# Patient Record
Sex: Female | Born: 1955 | Race: White | Hispanic: No | Marital: Married | Smoking: Never smoker
Health system: Southern US, Community
[De-identification: ages and names within clinical notes are randomized; demographics above are authoritative.]

## PROBLEM LIST (undated history)

## (undated) DIAGNOSIS — E079 Disorder of thyroid, unspecified: Secondary | ICD-10-CM

## (undated) HISTORY — PX: THYROIDECTOMY: SHX17

## (undated) HISTORY — PX: CHOLECYSTECTOMY: SHX55

## (undated) HISTORY — PX: TONSILLECTOMY: SUR1361

---

## 1999-10-13 ENCOUNTER — Encounter: Payer: Self-pay | Admitting: Family Medicine

## 1999-10-13 ENCOUNTER — Encounter: Admission: RE | Admit: 1999-10-13 | Discharge: 1999-10-13 | Payer: Self-pay | Admitting: Family Medicine

## 2000-04-10 ENCOUNTER — Encounter: Admission: RE | Admit: 2000-04-10 | Discharge: 2000-04-10 | Payer: Self-pay | Admitting: Family Medicine

## 2000-04-10 ENCOUNTER — Encounter: Payer: Self-pay | Admitting: Family Medicine

## 2001-04-11 ENCOUNTER — Encounter: Admission: RE | Admit: 2001-04-11 | Discharge: 2001-04-11 | Payer: Self-pay | Admitting: Family Medicine

## 2001-04-11 ENCOUNTER — Encounter: Payer: Self-pay | Admitting: Family Medicine

## 2004-01-21 ENCOUNTER — Encounter: Admission: RE | Admit: 2004-01-21 | Discharge: 2004-01-21 | Payer: Self-pay | Admitting: Family Medicine

## 2004-02-02 ENCOUNTER — Other Ambulatory Visit: Admission: RE | Admit: 2004-02-02 | Discharge: 2004-02-02 | Payer: Self-pay | Admitting: Family Medicine

## 2005-02-17 ENCOUNTER — Encounter: Admission: RE | Admit: 2005-02-17 | Discharge: 2005-02-17 | Payer: Self-pay | Admitting: Family Medicine

## 2005-02-21 ENCOUNTER — Other Ambulatory Visit: Admission: RE | Admit: 2005-02-21 | Discharge: 2005-02-21 | Payer: Self-pay | Admitting: Family Medicine

## 2006-02-24 ENCOUNTER — Encounter: Admission: RE | Admit: 2006-02-24 | Discharge: 2006-02-24 | Payer: Self-pay | Admitting: Family Medicine

## 2006-03-06 ENCOUNTER — Encounter: Admission: RE | Admit: 2006-03-06 | Discharge: 2006-03-06 | Payer: Self-pay | Admitting: Family Medicine

## 2006-03-07 ENCOUNTER — Other Ambulatory Visit: Admission: RE | Admit: 2006-03-07 | Discharge: 2006-03-07 | Payer: Self-pay | Admitting: Family Medicine

## 2007-03-19 ENCOUNTER — Encounter: Admission: RE | Admit: 2007-03-19 | Discharge: 2007-03-19 | Payer: Self-pay | Admitting: Family Medicine

## 2007-05-29 ENCOUNTER — Other Ambulatory Visit: Admission: RE | Admit: 2007-05-29 | Discharge: 2007-05-29 | Payer: Self-pay | Admitting: Family Medicine

## 2008-03-21 ENCOUNTER — Encounter: Admission: RE | Admit: 2008-03-21 | Discharge: 2008-03-21 | Payer: Self-pay | Admitting: Family Medicine

## 2009-03-26 ENCOUNTER — Encounter: Admission: RE | Admit: 2009-03-26 | Discharge: 2009-03-26 | Payer: Self-pay | Admitting: Family Medicine

## 2009-06-11 ENCOUNTER — Other Ambulatory Visit: Admission: RE | Admit: 2009-06-11 | Discharge: 2009-06-11 | Payer: Self-pay | Admitting: Family Medicine

## 2010-04-13 ENCOUNTER — Encounter: Admission: RE | Admit: 2010-04-13 | Discharge: 2010-04-13 | Payer: Self-pay | Admitting: Family Medicine

## 2011-06-08 ENCOUNTER — Other Ambulatory Visit: Payer: Self-pay | Admitting: Family Medicine

## 2011-06-08 DIAGNOSIS — Z1231 Encounter for screening mammogram for malignant neoplasm of breast: Secondary | ICD-10-CM

## 2011-06-20 ENCOUNTER — Ambulatory Visit
Admission: RE | Admit: 2011-06-20 | Discharge: 2011-06-20 | Disposition: A | Discharge status: Home / Self Care | Payer: BC Managed Care – PPO | Source: Ambulatory Visit | Attending: Family Medicine | Admitting: Family Medicine

## 2011-06-20 DIAGNOSIS — Z1231 Encounter for screening mammogram for malignant neoplasm of breast: Secondary | ICD-10-CM

## 2012-06-25 ENCOUNTER — Other Ambulatory Visit: Payer: Self-pay | Admitting: Family Medicine

## 2012-06-25 DIAGNOSIS — Z1231 Encounter for screening mammogram for malignant neoplasm of breast: Secondary | ICD-10-CM

## 2012-07-18 ENCOUNTER — Ambulatory Visit
Admission: RE | Admit: 2012-07-18 | Discharge: 2012-07-18 | Disposition: A | Discharge status: Home / Self Care | Payer: BC Managed Care – PPO | Source: Ambulatory Visit | Attending: Family Medicine | Admitting: Family Medicine

## 2012-07-18 DIAGNOSIS — Z1231 Encounter for screening mammogram for malignant neoplasm of breast: Secondary | ICD-10-CM

## 2013-06-10 ENCOUNTER — Other Ambulatory Visit: Payer: Self-pay

## 2013-06-10 DIAGNOSIS — Z1231 Encounter for screening mammogram for malignant neoplasm of breast: Secondary | ICD-10-CM

## 2013-07-19 ENCOUNTER — Ambulatory Visit
Admission: RE | Admit: 2013-07-19 | Discharge: 2013-07-19 | Disposition: A | Discharge status: Home / Self Care | Payer: BC Managed Care – HMO | Source: Ambulatory Visit

## 2013-07-19 DIAGNOSIS — Z1231 Encounter for screening mammogram for malignant neoplasm of breast: Secondary | ICD-10-CM

## 2013-09-17 ENCOUNTER — Ambulatory Visit
Admission: RE | Admit: 2013-09-17 | Discharge: 2013-09-17 | Disposition: A | Discharge status: Home / Self Care | Payer: BC Managed Care – PPO | Source: Ambulatory Visit | Attending: Family Medicine | Admitting: Family Medicine

## 2013-09-17 ENCOUNTER — Other Ambulatory Visit: Payer: Self-pay | Admitting: Family Medicine

## 2013-09-17 DIAGNOSIS — M25561 Pain in right knee: Secondary | ICD-10-CM

## 2013-09-17 DIAGNOSIS — M25562 Pain in left knee: Secondary | ICD-10-CM

## 2013-11-07 ENCOUNTER — Emergency Department (HOSPITAL_BASED_OUTPATIENT_CLINIC_OR_DEPARTMENT_OTHER): Payer: BC Managed Care – HMO

## 2013-11-07 ENCOUNTER — Encounter (HOSPITAL_BASED_OUTPATIENT_CLINIC_OR_DEPARTMENT_OTHER): Payer: Self-pay | Admitting: Emergency Medicine

## 2013-11-07 ENCOUNTER — Emergency Department (HOSPITAL_BASED_OUTPATIENT_CLINIC_OR_DEPARTMENT_OTHER)
Admission: EM | Admit: 2013-11-07 | Discharge: 2013-11-07 | Disposition: A | Discharge status: Home / Self Care | Payer: BC Managed Care – HMO | Attending: Emergency Medicine | Admitting: Emergency Medicine

## 2013-11-07 DIAGNOSIS — IMO0002 Reserved for concepts with insufficient information to code with codable children: Secondary | ICD-10-CM | POA: Insufficient documentation

## 2013-11-07 DIAGNOSIS — M62838 Other muscle spasm: Secondary | ICD-10-CM

## 2013-11-07 DIAGNOSIS — E079 Disorder of thyroid, unspecified: Secondary | ICD-10-CM | POA: Insufficient documentation

## 2013-11-07 DIAGNOSIS — Z79899 Other long term (current) drug therapy: Secondary | ICD-10-CM | POA: Insufficient documentation

## 2013-11-07 DIAGNOSIS — N390 Urinary tract infection, site not specified: Secondary | ICD-10-CM

## 2013-11-07 DIAGNOSIS — Y9289 Other specified places as the place of occurrence of the external cause: Secondary | ICD-10-CM | POA: Insufficient documentation

## 2013-11-07 DIAGNOSIS — Z3202 Encounter for pregnancy test, result negative: Secondary | ICD-10-CM | POA: Insufficient documentation

## 2013-11-07 DIAGNOSIS — Y93B9 Activity, other involving muscle strengthening exercises: Secondary | ICD-10-CM | POA: Insufficient documentation

## 2013-11-07 DIAGNOSIS — M5380 Other specified dorsopathies, site unspecified: Secondary | ICD-10-CM | POA: Insufficient documentation

## 2013-11-07 DIAGNOSIS — X500XXA Overexertion from strenuous movement or load, initial encounter: Secondary | ICD-10-CM | POA: Insufficient documentation

## 2013-11-07 HISTORY — DX: Disorder of thyroid, unspecified: E07.9

## 2013-11-07 LAB — URINALYSIS, ROUTINE W REFLEX MICROSCOPIC
Bilirubin Urine: NEGATIVE
Glucose, UA: NEGATIVE mg/dL
Hgb urine dipstick: NEGATIVE
Ketones, ur: NEGATIVE mg/dL
Nitrite: NEGATIVE
Protein, ur: NEGATIVE mg/dL
Specific Gravity, Urine: 1.027 (ref 1.005–1.030)
Urobilinogen, UA: 0.2 mg/dL (ref 0.0–1.0)
pH: 5.5 (ref 5.0–8.0)

## 2013-11-07 LAB — URINE MICROSCOPIC-ADD ON

## 2013-11-07 LAB — PREGNANCY, URINE: Preg Test, Ur: NEGATIVE

## 2013-11-07 MED ORDER — KETOROLAC TROMETHAMINE 60 MG/2ML IM SOLN
60.0000 mg | Freq: Once | INTRAMUSCULAR | Status: AC
  Administered 2013-11-07: 60 mg via INTRAMUSCULAR
  Filled 2013-11-07: qty 2

## 2013-11-07 MED ORDER — MELOXICAM 7.5 MG PO TABS: 7.5000 mg | ORAL_TABLET | Freq: Every day | ORAL | Status: DC

## 2013-11-07 MED ORDER — NITROFURANTOIN MONOHYD MACRO 100 MG PO CAPS
100.0000 mg | ORAL_CAPSULE | Freq: Once | ORAL | Status: AC
  Administered 2013-11-07: 100 mg via ORAL
  Filled 2013-11-07: qty 1

## 2013-11-07 MED ORDER — METHOCARBAMOL 500 MG PO TABS
1000.0000 mg | ORAL_TABLET | Freq: Once | ORAL | Status: AC
  Administered 2013-11-07: 1000 mg via ORAL
  Filled 2013-11-07: qty 2

## 2013-11-07 MED ORDER — METHOCARBAMOL 500 MG PO TABS: 500.0000 mg | ORAL_TABLET | Freq: Two times a day (BID) | ORAL | Status: DC

## 2013-11-07 MED ORDER — NITROFURANTOIN MONOHYD MACRO 100 MG PO CAPS: 100.0000 mg | ORAL_CAPSULE | Freq: Two times a day (BID) | ORAL | Status: DC

## 2013-11-07 NOTE — ED Notes (Signed)
Pt c/o back pain x 4 days

## 2013-11-07 NOTE — ED Provider Notes (Signed)
CSN: [id number]     Arrival date & time 11/07/13  0023 History   First MD Initiated Contact with Patient 11/07/13 0113     Chief Complaint  Patient presents with  . Back Pain   (Consider location/radiation/quality/duration/timing/severity/associated sxs/prior Treatment) Patient is a 58 y.o. female presenting with back pain. The history is provided by the patient.  Back Pain Pain location: lumbar paraspinal. Quality:  Aching Radiates to:  Does not radiate Pain severity:  Severe Pain is:  Same all the time Onset quality:  Sudden (after lifting weights and zumba class on Monday) Timing:  Constant Progression:  Unchanged Chronicity:  New Context: lifting heavy objects   Relieved by:  Nothing Worsened by:  Nothing tried Ineffective treatments:  None tried Associated symptoms: no abdominal pain, no abdominal swelling, no bladder incontinence, no bowel incontinence, no chest pain, no dysuria, no fever, no headaches, no leg pain, no numbness, no paresthesias, no pelvic pain, no perianal numbness, no tingling, no weakness and no weight loss   Risk factors: no lack of exercise     Past Medical History  Diagnosis Date  . Thyroid disease    Past Surgical History  Procedure Laterality Date  . Cholecystectomy    . Tonsillectomy    . Thyroidectomy     History reviewed. No pertinent family history. History  Substance Use Topics  . Smoking status: Never Smoker   . Smokeless tobacco: Not on file  . Alcohol Use: No   OB History   Grav Para Term Preterm Abortions TAB SAB Ect Mult Living                 Review of Systems  Constitutional: Negative for fever and weight loss.  Cardiovascular: Negative for chest pain.  Gastrointestinal: Negative for abdominal pain and bowel incontinence.  Genitourinary: Negative for bladder incontinence, dysuria and pelvic pain.  Musculoskeletal: Positive for back pain.  Neurological: Negative for tingling, weakness, numbness, headaches and paresthesias.   All other systems reviewed and are negative.    Allergies  Morphine and related and Sulfa antibiotics  Home Medications   Current Outpatient Rx  Name  Route  Sig  Dispense  Refill  . levothyroxine (SYNTHROID, LEVOTHROID) 25 MCG tablet   Oral   Take 25 mcg by mouth daily before breakfast.         . zolpidem (AMBIEN CR) 6.25 MG CR tablet   Oral   Take 6.25 mg by mouth at bedtime as needed for sleep.          BP 137/81  Pulse 80  Temp(Src) 97.7 F (36.5 C) (Oral)  Resp 16  Ht 5\' 10"  (1.778 m)  Wt 200 lb (90.719 kg)  BMI 28.70 kg/m2  SpO2 100% Physical Exam  Constitutional: She is oriented to person, place, and time. She appears well-developed and well-nourished. No distress.  HENT:  Head: Normocephalic and atraumatic.  Mouth/Throat: Oropharynx is clear and moist.  Eyes: Conjunctivae are normal. Pupils are equal, round, and reactive to light.  Neck: Normal range of motion. Neck supple.  Cardiovascular: Normal rate, regular rhythm and intact distal pulses.   Pulmonary/Chest: Effort normal and breath sounds normal. She has no wheezes. She has no rales.  Abdominal: Soft. Bowel sounds are normal. There is no tenderness. There is no rebound and no guarding.  Musculoskeletal: Normal range of motion.  Neurological: She is alert and oriented to person, place, and time. She has normal reflexes.  Paraspinal spasm.  No step offs or crepitance  over the c t or l spine  Skin: Skin is warm and dry.  Psychiatric: She has a normal mood and affect.    ED Course  Procedures (including critical care time) Labs Review Labs Reviewed  URINALYSIS, ROUTINE W REFLEX MICROSCOPIC - Abnormal; Notable for the following:    Leukocytes, UA SMALL (*)    All other components within normal limits  URINE MICROSCOPIC-ADD ON - Abnormal; Notable for the following:    Squamous Epithelial / LPF FEW (*)    Bacteria, UA MANY (*)    All other components within normal limits  URINE CULTURE  PREGNANCY,  URINE   Imaging Review No results found.  EKG Interpretation   None       MDM  No diagnosis found. Will treat for muscle spasm and UTI   Riane Rung K Lacosta Hargan-Rasch, MD 11/07/13 [id number]

## 2013-11-07 NOTE — ED Notes (Signed)
Patient transported to X-ray 

## 2013-11-07 NOTE — ED Notes (Signed)
Strained back Monday doing work out

## 2013-11-08 LAB — URINE CULTURE: Colony Count: 60000

## 2014-07-15 ENCOUNTER — Other Ambulatory Visit: Payer: Self-pay

## 2014-07-15 DIAGNOSIS — Z1231 Encounter for screening mammogram for malignant neoplasm of breast: Secondary | ICD-10-CM

## 2014-07-23 ENCOUNTER — Ambulatory Visit
Admission: RE | Admit: 2014-07-23 | Discharge: 2014-07-23 | Disposition: A | Discharge status: Home / Self Care | Payer: BC Managed Care – PPO | Source: Ambulatory Visit

## 2014-07-23 DIAGNOSIS — Z1231 Encounter for screening mammogram for malignant neoplasm of breast: Secondary | ICD-10-CM

## 2015-09-08 ENCOUNTER — Other Ambulatory Visit: Payer: Self-pay

## 2015-09-08 DIAGNOSIS — Z1231 Encounter for screening mammogram for malignant neoplasm of breast: Secondary | ICD-10-CM

## 2015-09-23 ENCOUNTER — Ambulatory Visit
Admission: RE | Admit: 2015-09-23 | Discharge: 2015-09-23 | Disposition: A | Discharge status: Home / Self Care | Payer: BLUE CROSS/BLUE SHIELD | Source: Ambulatory Visit

## 2015-09-23 DIAGNOSIS — Z1231 Encounter for screening mammogram for malignant neoplasm of breast: Secondary | ICD-10-CM

## 2015-09-28 ENCOUNTER — Other Ambulatory Visit: Payer: Self-pay | Admitting: Family Medicine

## 2015-09-28 DIAGNOSIS — R928 Other abnormal and inconclusive findings on diagnostic imaging of breast: Secondary | ICD-10-CM

## 2015-10-02 ENCOUNTER — Ambulatory Visit
Admission: RE | Admit: 2015-10-02 | Discharge: 2015-10-02 | Disposition: A | Discharge status: Home / Self Care | Payer: BLUE CROSS/BLUE SHIELD | Source: Ambulatory Visit | Attending: Family Medicine | Admitting: Family Medicine

## 2015-10-02 DIAGNOSIS — R928 Other abnormal and inconclusive findings on diagnostic imaging of breast: Secondary | ICD-10-CM

## 2016-02-24 ENCOUNTER — Other Ambulatory Visit: Payer: Self-pay | Admitting: Family Medicine

## 2016-02-24 DIAGNOSIS — N6320 Unspecified lump in the left breast, unspecified quadrant: Secondary | ICD-10-CM

## 2016-04-04 ENCOUNTER — Ambulatory Visit
Admission: RE | Admit: 2016-04-04 | Discharge: 2016-04-04 | Disposition: A | Discharge status: Home / Self Care | Payer: BLUE CROSS/BLUE SHIELD | Source: Ambulatory Visit | Attending: Family Medicine | Admitting: Family Medicine

## 2016-04-04 ENCOUNTER — Other Ambulatory Visit: Payer: Self-pay | Admitting: Family Medicine

## 2016-04-04 DIAGNOSIS — N6320 Unspecified lump in the left breast, unspecified quadrant: Secondary | ICD-10-CM

## 2016-10-13 ENCOUNTER — Other Ambulatory Visit: Payer: Self-pay | Admitting: Family Medicine

## 2016-10-13 DIAGNOSIS — N6320 Unspecified lump in the left breast, unspecified quadrant: Secondary | ICD-10-CM

## 2016-10-21 ENCOUNTER — Ambulatory Visit
Admission: RE | Admit: 2016-10-21 | Discharge: 2016-10-21 | Disposition: A | Discharge status: Home / Self Care | Payer: BLUE CROSS/BLUE SHIELD | Source: Ambulatory Visit | Attending: Family Medicine | Admitting: Family Medicine

## 2016-10-21 DIAGNOSIS — N6320 Unspecified lump in the left breast, unspecified quadrant: Secondary | ICD-10-CM

## 2017-09-25 ENCOUNTER — Other Ambulatory Visit: Payer: Self-pay | Admitting: Family Medicine

## 2017-09-25 DIAGNOSIS — Z1231 Encounter for screening mammogram for malignant neoplasm of breast: Secondary | ICD-10-CM

## 2017-11-03 ENCOUNTER — Ambulatory Visit
Admission: RE | Admit: 2017-11-03 | Discharge: 2017-11-03 | Disposition: A | Discharge status: Home / Self Care | Payer: BLUE CROSS/BLUE SHIELD | Source: Ambulatory Visit | Attending: Family Medicine | Admitting: Family Medicine

## 2017-11-03 DIAGNOSIS — Z1231 Encounter for screening mammogram for malignant neoplasm of breast: Secondary | ICD-10-CM

## 2017-11-09 ENCOUNTER — Ambulatory Visit: Payer: Self-pay

## 2018-03-13 DIAGNOSIS — M62830 Muscle spasm of back: Secondary | ICD-10-CM | POA: Diagnosis not present

## 2018-03-13 DIAGNOSIS — M9903 Segmental and somatic dysfunction of lumbar region: Secondary | ICD-10-CM | POA: Diagnosis not present

## 2018-03-13 DIAGNOSIS — M545 Low back pain: Secondary | ICD-10-CM | POA: Diagnosis not present

## 2018-03-13 DIAGNOSIS — M5136 Other intervertebral disc degeneration, lumbar region: Secondary | ICD-10-CM | POA: Diagnosis not present

## 2018-03-22 DIAGNOSIS — Z209 Contact with and (suspected) exposure to unspecified communicable disease: Secondary | ICD-10-CM | POA: Diagnosis not present

## 2018-03-22 DIAGNOSIS — Z23 Encounter for immunization: Secondary | ICD-10-CM | POA: Diagnosis not present

## 2018-03-22 DIAGNOSIS — G4700 Insomnia, unspecified: Secondary | ICD-10-CM | POA: Diagnosis not present

## 2018-03-22 DIAGNOSIS — L309 Dermatitis, unspecified: Secondary | ICD-10-CM | POA: Diagnosis not present

## 2018-03-22 DIAGNOSIS — Z119 Encounter for screening for infectious and parasitic diseases, unspecified: Secondary | ICD-10-CM | POA: Diagnosis not present

## 2018-03-22 DIAGNOSIS — E039 Hypothyroidism, unspecified: Secondary | ICD-10-CM | POA: Diagnosis not present

## 2018-07-10 DIAGNOSIS — M545 Low back pain: Secondary | ICD-10-CM | POA: Diagnosis not present

## 2018-07-10 DIAGNOSIS — M62830 Muscle spasm of back: Secondary | ICD-10-CM | POA: Diagnosis not present

## 2018-07-10 DIAGNOSIS — M5136 Other intervertebral disc degeneration, lumbar region: Secondary | ICD-10-CM | POA: Diagnosis not present

## 2018-07-10 DIAGNOSIS — M9903 Segmental and somatic dysfunction of lumbar region: Secondary | ICD-10-CM | POA: Diagnosis not present

## 2018-07-18 DIAGNOSIS — L814 Other melanin hyperpigmentation: Secondary | ICD-10-CM | POA: Diagnosis not present

## 2018-07-18 DIAGNOSIS — D225 Melanocytic nevi of trunk: Secondary | ICD-10-CM | POA: Diagnosis not present

## 2018-07-18 DIAGNOSIS — D1801 Hemangioma of skin and subcutaneous tissue: Secondary | ICD-10-CM | POA: Diagnosis not present

## 2018-07-18 DIAGNOSIS — L821 Other seborrheic keratosis: Secondary | ICD-10-CM | POA: Diagnosis not present

## 2018-08-13 DIAGNOSIS — M5136 Other intervertebral disc degeneration, lumbar region: Secondary | ICD-10-CM | POA: Diagnosis not present

## 2018-08-13 DIAGNOSIS — M545 Low back pain: Secondary | ICD-10-CM | POA: Diagnosis not present

## 2018-08-13 DIAGNOSIS — M9903 Segmental and somatic dysfunction of lumbar region: Secondary | ICD-10-CM | POA: Diagnosis not present

## 2018-08-13 DIAGNOSIS — M62830 Muscle spasm of back: Secondary | ICD-10-CM | POA: Diagnosis not present

## 2018-09-17 DIAGNOSIS — M9903 Segmental and somatic dysfunction of lumbar region: Secondary | ICD-10-CM | POA: Diagnosis not present

## 2018-09-17 DIAGNOSIS — M62830 Muscle spasm of back: Secondary | ICD-10-CM | POA: Diagnosis not present

## 2018-09-17 DIAGNOSIS — M5136 Other intervertebral disc degeneration, lumbar region: Secondary | ICD-10-CM | POA: Diagnosis not present

## 2018-09-17 DIAGNOSIS — M545 Low back pain: Secondary | ICD-10-CM | POA: Diagnosis not present

## 2018-10-17 DIAGNOSIS — M5136 Other intervertebral disc degeneration, lumbar region: Secondary | ICD-10-CM | POA: Diagnosis not present

## 2018-10-17 DIAGNOSIS — M62830 Muscle spasm of back: Secondary | ICD-10-CM | POA: Diagnosis not present

## 2018-10-17 DIAGNOSIS — M545 Low back pain: Secondary | ICD-10-CM | POA: Diagnosis not present

## 2018-10-17 DIAGNOSIS — M9903 Segmental and somatic dysfunction of lumbar region: Secondary | ICD-10-CM | POA: Diagnosis not present

## 2018-10-27 DIAGNOSIS — R600 Localized edema: Secondary | ICD-10-CM | POA: Diagnosis not present

## 2018-11-08 DIAGNOSIS — M545 Low back pain: Secondary | ICD-10-CM | POA: Diagnosis not present

## 2018-11-08 DIAGNOSIS — M5136 Other intervertebral disc degeneration, lumbar region: Secondary | ICD-10-CM | POA: Diagnosis not present

## 2018-11-08 DIAGNOSIS — M62830 Muscle spasm of back: Secondary | ICD-10-CM | POA: Diagnosis not present

## 2018-11-08 DIAGNOSIS — M9903 Segmental and somatic dysfunction of lumbar region: Secondary | ICD-10-CM | POA: Diagnosis not present

## 2018-12-06 DIAGNOSIS — M5136 Other intervertebral disc degeneration, lumbar region: Secondary | ICD-10-CM | POA: Diagnosis not present

## 2018-12-06 DIAGNOSIS — M545 Low back pain: Secondary | ICD-10-CM | POA: Diagnosis not present

## 2018-12-06 DIAGNOSIS — M9903 Segmental and somatic dysfunction of lumbar region: Secondary | ICD-10-CM | POA: Diagnosis not present

## 2018-12-06 DIAGNOSIS — M62830 Muscle spasm of back: Secondary | ICD-10-CM | POA: Diagnosis not present

## 2018-12-26 DIAGNOSIS — L0291 Cutaneous abscess, unspecified: Secondary | ICD-10-CM | POA: Diagnosis not present

## 2018-12-27 DIAGNOSIS — M5136 Other intervertebral disc degeneration, lumbar region: Secondary | ICD-10-CM | POA: Diagnosis not present

## 2018-12-27 DIAGNOSIS — M62830 Muscle spasm of back: Secondary | ICD-10-CM | POA: Diagnosis not present

## 2018-12-27 DIAGNOSIS — M545 Low back pain: Secondary | ICD-10-CM | POA: Diagnosis not present

## 2018-12-27 DIAGNOSIS — M9903 Segmental and somatic dysfunction of lumbar region: Secondary | ICD-10-CM | POA: Diagnosis not present

## 2019-01-01 ENCOUNTER — Other Ambulatory Visit: Payer: Self-pay | Admitting: Family Medicine

## 2019-01-01 DIAGNOSIS — Z1231 Encounter for screening mammogram for malignant neoplasm of breast: Secondary | ICD-10-CM

## 2019-01-09 ENCOUNTER — Other Ambulatory Visit: Payer: Self-pay

## 2019-01-09 ENCOUNTER — Ambulatory Visit
Admission: RE | Admit: 2019-01-09 | Discharge: 2019-01-09 | Disposition: A | Discharge status: Home / Self Care | Payer: BLUE CROSS/BLUE SHIELD | Source: Ambulatory Visit

## 2019-01-09 DIAGNOSIS — Z1231 Encounter for screening mammogram for malignant neoplasm of breast: Secondary | ICD-10-CM | POA: Diagnosis not present

## 2019-01-10 DIAGNOSIS — M5136 Other intervertebral disc degeneration, lumbar region: Secondary | ICD-10-CM | POA: Diagnosis not present

## 2019-01-10 DIAGNOSIS — M62830 Muscle spasm of back: Secondary | ICD-10-CM | POA: Diagnosis not present

## 2019-01-10 DIAGNOSIS — M545 Low back pain: Secondary | ICD-10-CM | POA: Diagnosis not present

## 2019-01-10 DIAGNOSIS — M9903 Segmental and somatic dysfunction of lumbar region: Secondary | ICD-10-CM | POA: Diagnosis not present

## 2019-01-25 DIAGNOSIS — G4700 Insomnia, unspecified: Secondary | ICD-10-CM | POA: Diagnosis not present

## 2019-01-25 DIAGNOSIS — E039 Hypothyroidism, unspecified: Secondary | ICD-10-CM | POA: Diagnosis not present

## 2019-01-25 DIAGNOSIS — L309 Dermatitis, unspecified: Secondary | ICD-10-CM | POA: Diagnosis not present

## 2019-02-04 DIAGNOSIS — M62830 Muscle spasm of back: Secondary | ICD-10-CM | POA: Diagnosis not present

## 2019-02-04 DIAGNOSIS — M5136 Other intervertebral disc degeneration, lumbar region: Secondary | ICD-10-CM | POA: Diagnosis not present

## 2019-02-04 DIAGNOSIS — M9903 Segmental and somatic dysfunction of lumbar region: Secondary | ICD-10-CM | POA: Diagnosis not present

## 2019-02-04 DIAGNOSIS — M545 Low back pain: Secondary | ICD-10-CM | POA: Diagnosis not present

## 2019-02-14 DIAGNOSIS — M1711 Unilateral primary osteoarthritis, right knee: Secondary | ICD-10-CM | POA: Diagnosis not present

## 2019-02-14 DIAGNOSIS — S83281A Other tear of lateral meniscus, current injury, right knee, initial encounter: Secondary | ICD-10-CM | POA: Diagnosis not present

## 2019-02-18 DIAGNOSIS — M62830 Muscle spasm of back: Secondary | ICD-10-CM | POA: Diagnosis not present

## 2019-02-18 DIAGNOSIS — M9903 Segmental and somatic dysfunction of lumbar region: Secondary | ICD-10-CM | POA: Diagnosis not present

## 2019-02-18 DIAGNOSIS — M545 Low back pain: Secondary | ICD-10-CM | POA: Diagnosis not present

## 2019-02-18 DIAGNOSIS — M5136 Other intervertebral disc degeneration, lumbar region: Secondary | ICD-10-CM | POA: Diagnosis not present

## 2019-02-22 DIAGNOSIS — M25561 Pain in right knee: Secondary | ICD-10-CM | POA: Diagnosis not present

## 2019-02-27 DIAGNOSIS — M1711 Unilateral primary osteoarthritis, right knee: Secondary | ICD-10-CM | POA: Diagnosis not present

## 2019-04-08 DIAGNOSIS — M62830 Muscle spasm of back: Secondary | ICD-10-CM | POA: Diagnosis not present

## 2019-04-08 DIAGNOSIS — M9903 Segmental and somatic dysfunction of lumbar region: Secondary | ICD-10-CM | POA: Diagnosis not present

## 2019-04-08 DIAGNOSIS — M545 Low back pain: Secondary | ICD-10-CM | POA: Diagnosis not present

## 2019-04-08 DIAGNOSIS — M5136 Other intervertebral disc degeneration, lumbar region: Secondary | ICD-10-CM | POA: Diagnosis not present

## 2019-04-11 DIAGNOSIS — M9903 Segmental and somatic dysfunction of lumbar region: Secondary | ICD-10-CM | POA: Diagnosis not present

## 2019-04-11 DIAGNOSIS — M545 Low back pain: Secondary | ICD-10-CM | POA: Diagnosis not present

## 2019-04-11 DIAGNOSIS — M62830 Muscle spasm of back: Secondary | ICD-10-CM | POA: Diagnosis not present

## 2019-04-11 DIAGNOSIS — M5136 Other intervertebral disc degeneration, lumbar region: Secondary | ICD-10-CM | POA: Diagnosis not present

## 2019-04-15 DIAGNOSIS — M545 Low back pain: Secondary | ICD-10-CM | POA: Diagnosis not present

## 2019-04-15 DIAGNOSIS — M5136 Other intervertebral disc degeneration, lumbar region: Secondary | ICD-10-CM | POA: Diagnosis not present

## 2019-04-15 DIAGNOSIS — M9903 Segmental and somatic dysfunction of lumbar region: Secondary | ICD-10-CM | POA: Diagnosis not present

## 2019-04-15 DIAGNOSIS — M62830 Muscle spasm of back: Secondary | ICD-10-CM | POA: Diagnosis not present

## 2019-04-18 DIAGNOSIS — M5136 Other intervertebral disc degeneration, lumbar region: Secondary | ICD-10-CM | POA: Diagnosis not present

## 2019-04-18 DIAGNOSIS — M9903 Segmental and somatic dysfunction of lumbar region: Secondary | ICD-10-CM | POA: Diagnosis not present

## 2019-04-18 DIAGNOSIS — M545 Low back pain: Secondary | ICD-10-CM | POA: Diagnosis not present

## 2019-04-18 DIAGNOSIS — M62830 Muscle spasm of back: Secondary | ICD-10-CM | POA: Diagnosis not present

## 2019-04-22 DIAGNOSIS — M9903 Segmental and somatic dysfunction of lumbar region: Secondary | ICD-10-CM | POA: Diagnosis not present

## 2019-04-22 DIAGNOSIS — M5136 Other intervertebral disc degeneration, lumbar region: Secondary | ICD-10-CM | POA: Diagnosis not present

## 2019-04-22 DIAGNOSIS — M62830 Muscle spasm of back: Secondary | ICD-10-CM | POA: Diagnosis not present

## 2019-04-22 DIAGNOSIS — M545 Low back pain: Secondary | ICD-10-CM | POA: Diagnosis not present

## 2019-04-25 DIAGNOSIS — M545 Low back pain: Secondary | ICD-10-CM | POA: Diagnosis not present

## 2019-04-25 DIAGNOSIS — M9903 Segmental and somatic dysfunction of lumbar region: Secondary | ICD-10-CM | POA: Diagnosis not present

## 2019-04-25 DIAGNOSIS — M5136 Other intervertebral disc degeneration, lumbar region: Secondary | ICD-10-CM | POA: Diagnosis not present

## 2019-04-25 DIAGNOSIS — M62830 Muscle spasm of back: Secondary | ICD-10-CM | POA: Diagnosis not present

## 2019-07-16 DIAGNOSIS — M5136 Other intervertebral disc degeneration, lumbar region: Secondary | ICD-10-CM | POA: Diagnosis not present

## 2019-07-16 DIAGNOSIS — M9903 Segmental and somatic dysfunction of lumbar region: Secondary | ICD-10-CM | POA: Diagnosis not present

## 2019-07-16 DIAGNOSIS — M62830 Muscle spasm of back: Secondary | ICD-10-CM | POA: Diagnosis not present

## 2019-07-16 DIAGNOSIS — M545 Low back pain: Secondary | ICD-10-CM | POA: Diagnosis not present

## 2019-08-14 DIAGNOSIS — H1132 Conjunctival hemorrhage, left eye: Secondary | ICD-10-CM | POA: Diagnosis not present

## 2019-08-15 DIAGNOSIS — M5136 Other intervertebral disc degeneration, lumbar region: Secondary | ICD-10-CM | POA: Diagnosis not present

## 2019-08-15 DIAGNOSIS — M62830 Muscle spasm of back: Secondary | ICD-10-CM | POA: Diagnosis not present

## 2019-08-15 DIAGNOSIS — M545 Low back pain: Secondary | ICD-10-CM | POA: Diagnosis not present

## 2019-08-15 DIAGNOSIS — M9903 Segmental and somatic dysfunction of lumbar region: Secondary | ICD-10-CM | POA: Diagnosis not present

## 2019-09-16 DIAGNOSIS — F439 Reaction to severe stress, unspecified: Secondary | ICD-10-CM | POA: Diagnosis not present

## 2019-09-19 DIAGNOSIS — M5136 Other intervertebral disc degeneration, lumbar region: Secondary | ICD-10-CM | POA: Diagnosis not present

## 2019-09-19 DIAGNOSIS — M62830 Muscle spasm of back: Secondary | ICD-10-CM | POA: Diagnosis not present

## 2019-09-19 DIAGNOSIS — M545 Low back pain: Secondary | ICD-10-CM | POA: Diagnosis not present

## 2019-09-19 DIAGNOSIS — M9903 Segmental and somatic dysfunction of lumbar region: Secondary | ICD-10-CM | POA: Diagnosis not present

## 2019-10-17 DIAGNOSIS — M9903 Segmental and somatic dysfunction of lumbar region: Secondary | ICD-10-CM | POA: Diagnosis not present

## 2019-10-17 DIAGNOSIS — M62830 Muscle spasm of back: Secondary | ICD-10-CM | POA: Diagnosis not present

## 2019-10-17 DIAGNOSIS — M545 Low back pain: Secondary | ICD-10-CM | POA: Diagnosis not present

## 2019-10-17 DIAGNOSIS — M5136 Other intervertebral disc degeneration, lumbar region: Secondary | ICD-10-CM | POA: Diagnosis not present

## 2019-11-14 DIAGNOSIS — M5136 Other intervertebral disc degeneration, lumbar region: Secondary | ICD-10-CM | POA: Diagnosis not present

## 2019-11-14 DIAGNOSIS — M545 Low back pain: Secondary | ICD-10-CM | POA: Diagnosis not present

## 2019-11-14 DIAGNOSIS — M62830 Muscle spasm of back: Secondary | ICD-10-CM | POA: Diagnosis not present

## 2019-11-14 DIAGNOSIS — M9903 Segmental and somatic dysfunction of lumbar region: Secondary | ICD-10-CM | POA: Diagnosis not present

## 2019-11-15 DIAGNOSIS — Z23 Encounter for immunization: Secondary | ICD-10-CM | POA: Diagnosis not present

## 2019-12-11 DIAGNOSIS — M9903 Segmental and somatic dysfunction of lumbar region: Secondary | ICD-10-CM | POA: Diagnosis not present

## 2019-12-11 DIAGNOSIS — M545 Low back pain: Secondary | ICD-10-CM | POA: Diagnosis not present

## 2019-12-11 DIAGNOSIS — M62830 Muscle spasm of back: Secondary | ICD-10-CM | POA: Diagnosis not present

## 2019-12-11 DIAGNOSIS — M5136 Other intervertebral disc degeneration, lumbar region: Secondary | ICD-10-CM | POA: Diagnosis not present

## 2020-01-08 DIAGNOSIS — M9903 Segmental and somatic dysfunction of lumbar region: Secondary | ICD-10-CM | POA: Diagnosis not present

## 2020-01-08 DIAGNOSIS — M545 Low back pain: Secondary | ICD-10-CM | POA: Diagnosis not present

## 2020-01-08 DIAGNOSIS — M62830 Muscle spasm of back: Secondary | ICD-10-CM | POA: Diagnosis not present

## 2020-01-08 DIAGNOSIS — M5136 Other intervertebral disc degeneration, lumbar region: Secondary | ICD-10-CM | POA: Diagnosis not present

## 2020-01-09 ENCOUNTER — Other Ambulatory Visit: Payer: Self-pay | Admitting: Family Medicine

## 2020-01-09 DIAGNOSIS — Z1231 Encounter for screening mammogram for malignant neoplasm of breast: Secondary | ICD-10-CM

## 2020-01-16 ENCOUNTER — Ambulatory Visit
Admission: RE | Admit: 2020-01-16 | Discharge: 2020-01-16 | Disposition: A | Discharge status: Home / Self Care | Payer: BC Managed Care – PPO | Source: Ambulatory Visit

## 2020-01-16 ENCOUNTER — Other Ambulatory Visit: Payer: Self-pay

## 2020-01-16 DIAGNOSIS — Z1231 Encounter for screening mammogram for malignant neoplasm of breast: Secondary | ICD-10-CM

## 2020-02-12 DIAGNOSIS — M5136 Other intervertebral disc degeneration, lumbar region: Secondary | ICD-10-CM | POA: Diagnosis not present

## 2020-02-12 DIAGNOSIS — M9903 Segmental and somatic dysfunction of lumbar region: Secondary | ICD-10-CM | POA: Diagnosis not present

## 2020-02-12 DIAGNOSIS — M545 Low back pain: Secondary | ICD-10-CM | POA: Diagnosis not present

## 2020-02-12 DIAGNOSIS — M62830 Muscle spasm of back: Secondary | ICD-10-CM | POA: Diagnosis not present

## 2020-03-03 DIAGNOSIS — Z20828 Contact with and (suspected) exposure to other viral communicable diseases: Secondary | ICD-10-CM | POA: Diagnosis not present

## 2020-03-03 DIAGNOSIS — J302 Other seasonal allergic rhinitis: Secondary | ICD-10-CM | POA: Diagnosis not present

## 2020-03-03 DIAGNOSIS — Z03818 Encounter for observation for suspected exposure to other biological agents ruled out: Secondary | ICD-10-CM | POA: Diagnosis not present

## 2020-03-05 DIAGNOSIS — L309 Dermatitis, unspecified: Secondary | ICD-10-CM | POA: Diagnosis not present

## 2020-03-05 DIAGNOSIS — E039 Hypothyroidism, unspecified: Secondary | ICD-10-CM | POA: Diagnosis not present

## 2020-03-05 DIAGNOSIS — G4700 Insomnia, unspecified: Secondary | ICD-10-CM | POA: Diagnosis not present

## 2020-03-10 DIAGNOSIS — M5136 Other intervertebral disc degeneration, lumbar region: Secondary | ICD-10-CM | POA: Diagnosis not present

## 2020-03-10 DIAGNOSIS — M545 Low back pain: Secondary | ICD-10-CM | POA: Diagnosis not present

## 2020-03-10 DIAGNOSIS — M62830 Muscle spasm of back: Secondary | ICD-10-CM | POA: Diagnosis not present

## 2020-03-10 DIAGNOSIS — M9903 Segmental and somatic dysfunction of lumbar region: Secondary | ICD-10-CM | POA: Diagnosis not present

## 2020-03-11 DIAGNOSIS — E039 Hypothyroidism, unspecified: Secondary | ICD-10-CM | POA: Diagnosis not present

## 2020-04-15 DIAGNOSIS — M5136 Other intervertebral disc degeneration, lumbar region: Secondary | ICD-10-CM | POA: Diagnosis not present

## 2020-04-15 DIAGNOSIS — M545 Low back pain: Secondary | ICD-10-CM | POA: Diagnosis not present

## 2020-04-15 DIAGNOSIS — M62830 Muscle spasm of back: Secondary | ICD-10-CM | POA: Diagnosis not present

## 2020-04-15 DIAGNOSIS — M9903 Segmental and somatic dysfunction of lumbar region: Secondary | ICD-10-CM | POA: Diagnosis not present

## 2020-04-19 IMAGING — MG DIGITAL SCREENING BILAT W/ TOMO W/ CAD
6 of 10 series · 6 of 30 positions shown · non-contrast
Comparison: Previous exam(s).

CLINICAL DATA: Screening.

EXAM:
DIGITAL SCREENING BILATERAL MAMMOGRAM WITH TOMO AND CAD

[L CC synth-2D]
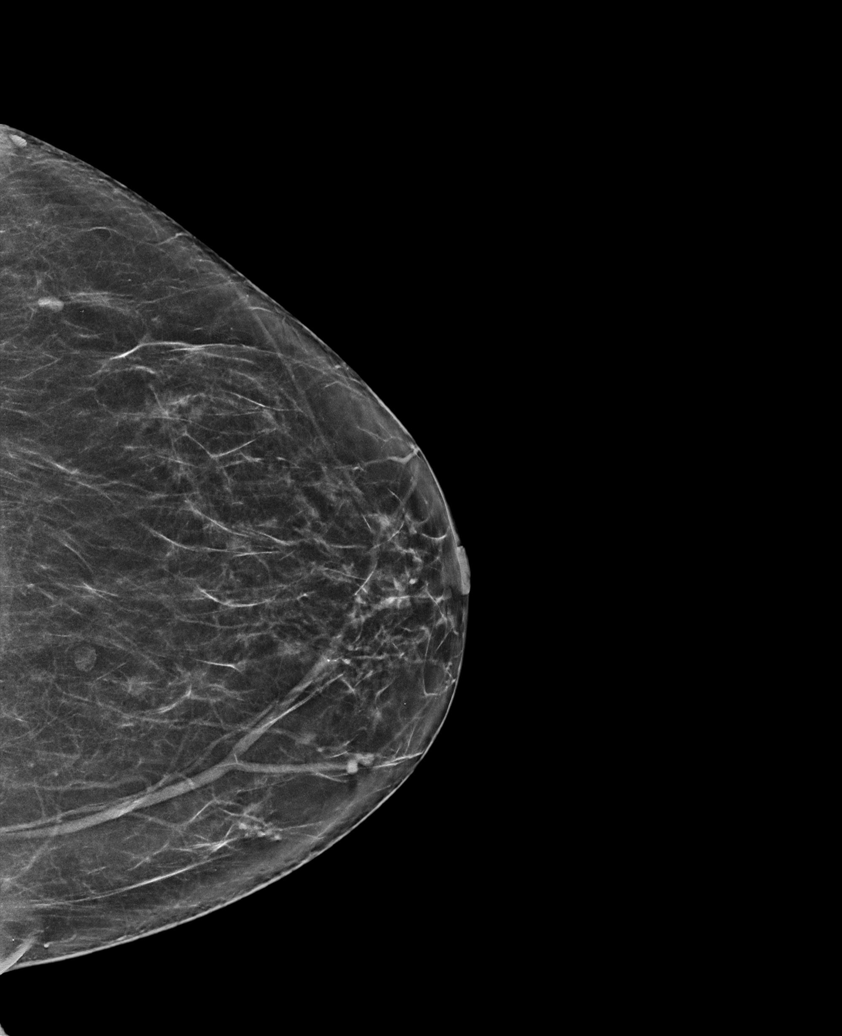

[L MLO synth-2D]
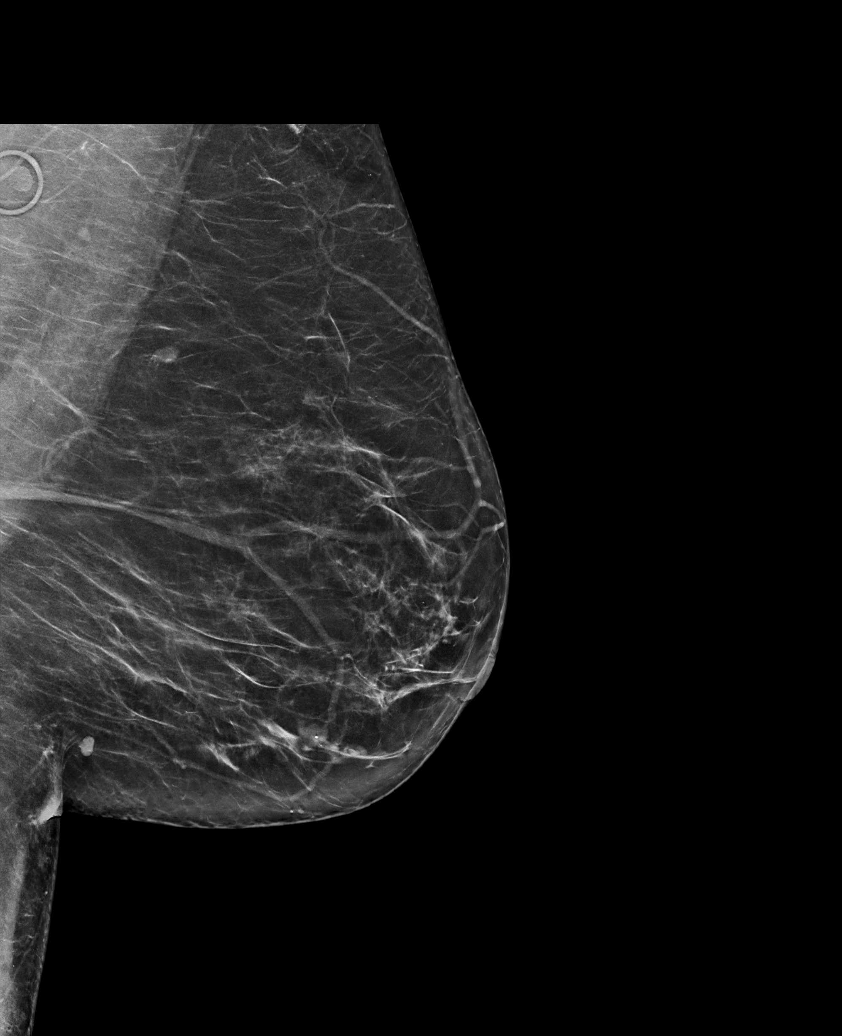

[R MLO synth-2D]
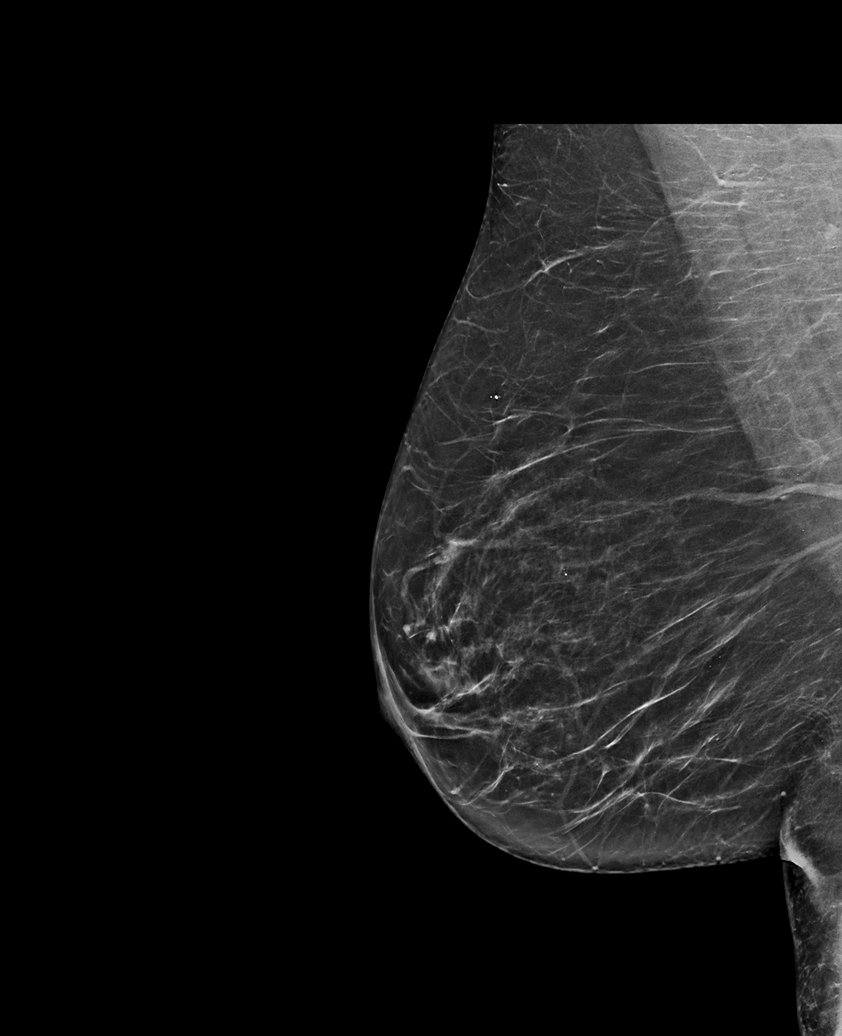

[R CC synth-2D (1 of 2)]
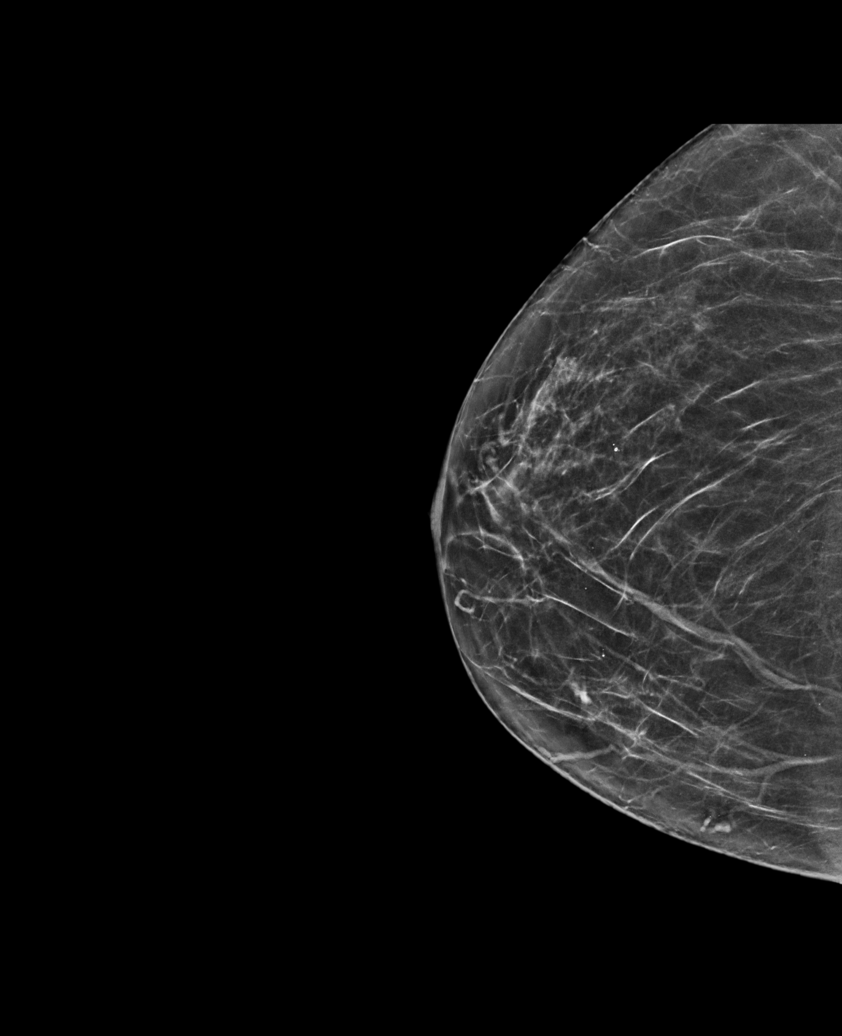

[R CC synth-2D (2 of 2)]
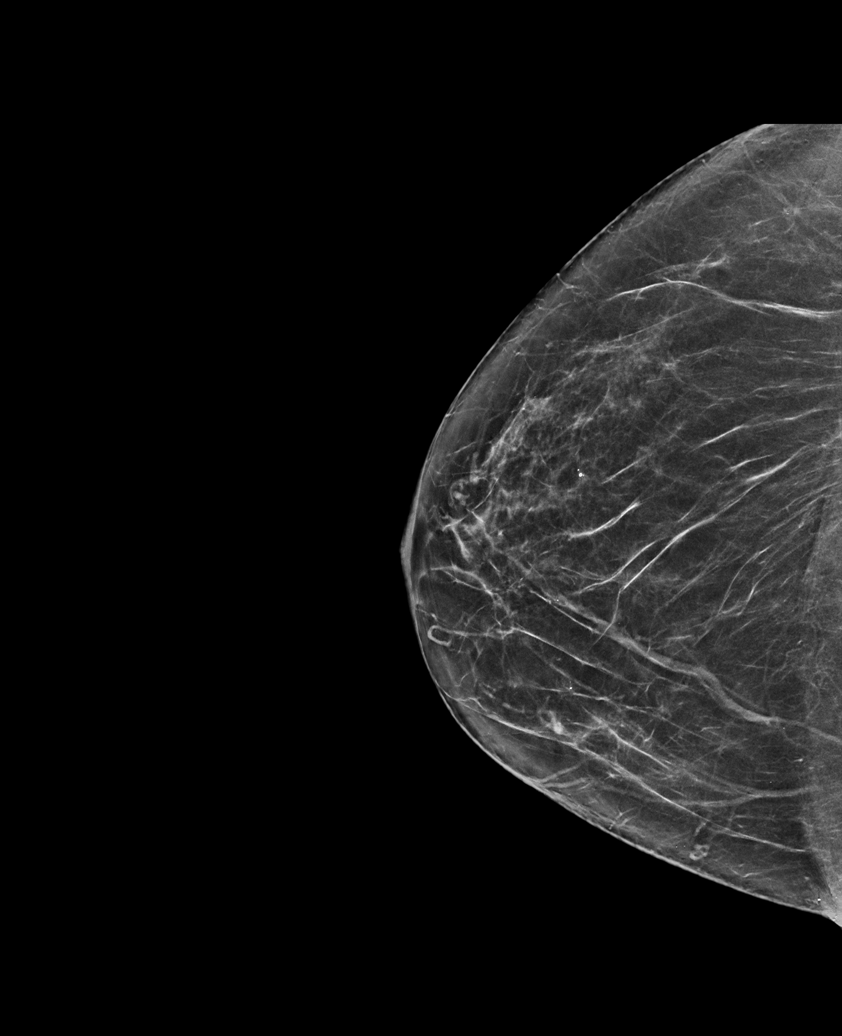

[R CC tomo · tomo slice 34/67.0]
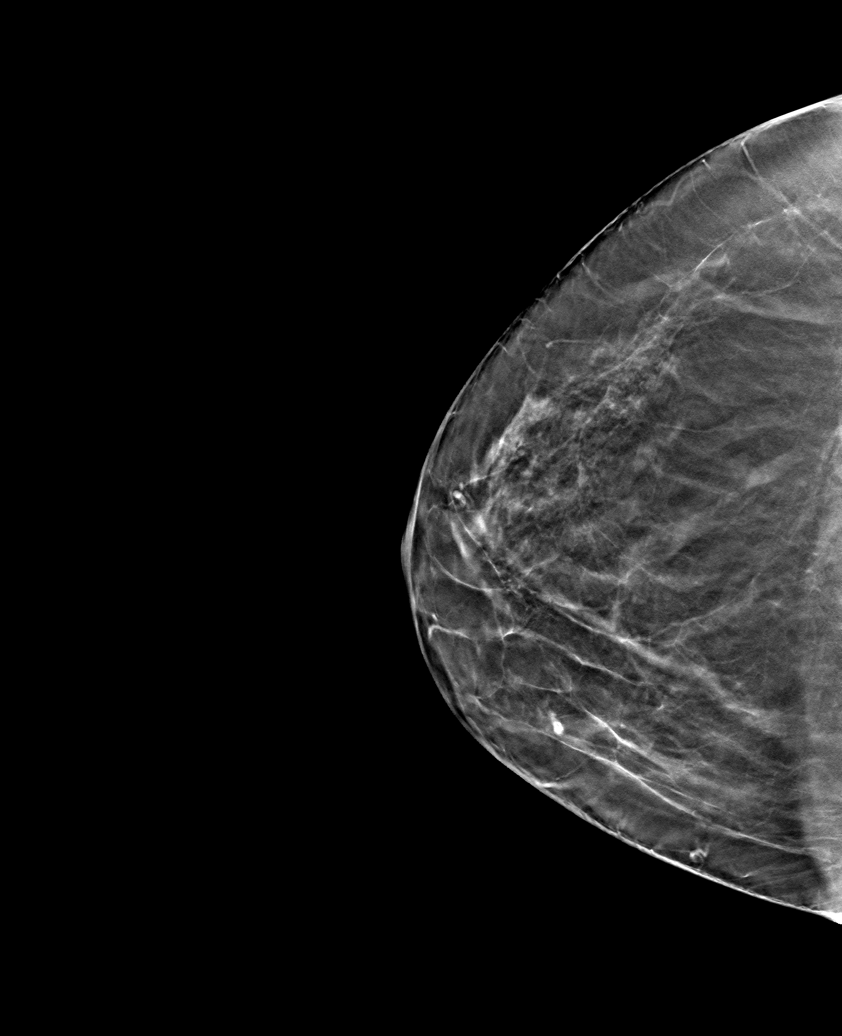

[6 of 30 positions shown; findings below may reference images not displayed]

ACR Breast Density Category b: There are scattered areas of
fibroglandular density.
FINDINGS: There are no findings suspicious for malignancy. Images were
processed with CAD.
IMPRESSION: No mammographic evidence of malignancy. A result letter of this
screening mammogram will be mailed directly to the patient.

RECOMMENDATION:
Screening mammogram in one year. (Code:[id number]-775)

BI-RADS CATEGORY  1: Negative.

## 2020-07-08 DIAGNOSIS — M545 Low back pain: Secondary | ICD-10-CM | POA: Diagnosis not present

## 2020-07-08 DIAGNOSIS — M62830 Muscle spasm of back: Secondary | ICD-10-CM | POA: Diagnosis not present

## 2020-07-08 DIAGNOSIS — M5136 Other intervertebral disc degeneration, lumbar region: Secondary | ICD-10-CM | POA: Diagnosis not present

## 2020-07-08 DIAGNOSIS — M9903 Segmental and somatic dysfunction of lumbar region: Secondary | ICD-10-CM | POA: Diagnosis not present

## 2020-07-16 DIAGNOSIS — Z131 Encounter for screening for diabetes mellitus: Secondary | ICD-10-CM | POA: Diagnosis not present

## 2020-07-16 DIAGNOSIS — Z23 Encounter for immunization: Secondary | ICD-10-CM | POA: Diagnosis not present

## 2020-07-16 DIAGNOSIS — Z0000 Encounter for general adult medical examination without abnormal findings: Secondary | ICD-10-CM | POA: Diagnosis not present

## 2020-07-16 DIAGNOSIS — M5136 Other intervertebral disc degeneration, lumbar region: Secondary | ICD-10-CM | POA: Diagnosis not present

## 2020-07-16 DIAGNOSIS — Z13220 Encounter for screening for lipoid disorders: Secondary | ICD-10-CM | POA: Diagnosis not present

## 2020-07-16 DIAGNOSIS — M62830 Muscle spasm of back: Secondary | ICD-10-CM | POA: Diagnosis not present

## 2020-07-16 DIAGNOSIS — M545 Low back pain: Secondary | ICD-10-CM | POA: Diagnosis not present

## 2020-07-16 DIAGNOSIS — M9903 Segmental and somatic dysfunction of lumbar region: Secondary | ICD-10-CM | POA: Diagnosis not present

## 2020-07-16 DIAGNOSIS — F419 Anxiety disorder, unspecified: Secondary | ICD-10-CM | POA: Diagnosis not present

## 2020-07-16 DIAGNOSIS — G4700 Insomnia, unspecified: Secondary | ICD-10-CM | POA: Diagnosis not present

## 2020-07-30 DIAGNOSIS — M62830 Muscle spasm of back: Secondary | ICD-10-CM | POA: Diagnosis not present

## 2020-07-30 DIAGNOSIS — M9903 Segmental and somatic dysfunction of lumbar region: Secondary | ICD-10-CM | POA: Diagnosis not present

## 2020-07-30 DIAGNOSIS — M9904 Segmental and somatic dysfunction of sacral region: Secondary | ICD-10-CM | POA: Diagnosis not present

## 2020-07-30 DIAGNOSIS — M5136 Other intervertebral disc degeneration, lumbar region: Secondary | ICD-10-CM | POA: Diagnosis not present

## 2020-08-13 DIAGNOSIS — M9903 Segmental and somatic dysfunction of lumbar region: Secondary | ICD-10-CM | POA: Diagnosis not present

## 2020-08-13 DIAGNOSIS — M9904 Segmental and somatic dysfunction of sacral region: Secondary | ICD-10-CM | POA: Diagnosis not present

## 2020-08-13 DIAGNOSIS — M5136 Other intervertebral disc degeneration, lumbar region: Secondary | ICD-10-CM | POA: Diagnosis not present

## 2020-08-13 DIAGNOSIS — M62830 Muscle spasm of back: Secondary | ICD-10-CM | POA: Diagnosis not present

## 2020-09-03 DIAGNOSIS — M9903 Segmental and somatic dysfunction of lumbar region: Secondary | ICD-10-CM | POA: Diagnosis not present

## 2020-09-03 DIAGNOSIS — M62830 Muscle spasm of back: Secondary | ICD-10-CM | POA: Diagnosis not present

## 2020-09-03 DIAGNOSIS — M9904 Segmental and somatic dysfunction of sacral region: Secondary | ICD-10-CM | POA: Diagnosis not present

## 2020-09-03 DIAGNOSIS — M5136 Other intervertebral disc degeneration, lumbar region: Secondary | ICD-10-CM | POA: Diagnosis not present

## 2020-09-23 DIAGNOSIS — M9903 Segmental and somatic dysfunction of lumbar region: Secondary | ICD-10-CM | POA: Diagnosis not present

## 2020-09-23 DIAGNOSIS — M5136 Other intervertebral disc degeneration, lumbar region: Secondary | ICD-10-CM | POA: Diagnosis not present

## 2020-09-23 DIAGNOSIS — M62830 Muscle spasm of back: Secondary | ICD-10-CM | POA: Diagnosis not present

## 2020-09-23 DIAGNOSIS — M9904 Segmental and somatic dysfunction of sacral region: Secondary | ICD-10-CM | POA: Diagnosis not present

## 2020-10-19 DIAGNOSIS — M62830 Muscle spasm of back: Secondary | ICD-10-CM | POA: Diagnosis not present

## 2020-10-19 DIAGNOSIS — M5136 Other intervertebral disc degeneration, lumbar region: Secondary | ICD-10-CM | POA: Diagnosis not present

## 2020-10-19 DIAGNOSIS — M9903 Segmental and somatic dysfunction of lumbar region: Secondary | ICD-10-CM | POA: Diagnosis not present

## 2020-10-19 DIAGNOSIS — M9904 Segmental and somatic dysfunction of sacral region: Secondary | ICD-10-CM | POA: Diagnosis not present

## 2020-12-29 ENCOUNTER — Other Ambulatory Visit: Payer: Self-pay | Admitting: Family Medicine

## 2020-12-29 DIAGNOSIS — Z1231 Encounter for screening mammogram for malignant neoplasm of breast: Secondary | ICD-10-CM

## 2021-02-11 DIAGNOSIS — Z1231 Encounter for screening mammogram for malignant neoplasm of breast: Secondary | ICD-10-CM

## 2021-03-19 ENCOUNTER — Other Ambulatory Visit: Payer: Self-pay

## 2021-03-19 ENCOUNTER — Ambulatory Visit
Admission: RE | Admit: 2021-03-19 | Discharge: 2021-03-19 | Disposition: A | Discharge status: Home / Self Care | Payer: BC Managed Care – PPO | Source: Ambulatory Visit | Attending: Family Medicine | Admitting: Family Medicine

## 2021-03-19 DIAGNOSIS — Z1231 Encounter for screening mammogram for malignant neoplasm of breast: Secondary | ICD-10-CM

## 2022-03-08 ENCOUNTER — Other Ambulatory Visit: Payer: Self-pay | Admitting: Family Medicine

## 2022-03-08 DIAGNOSIS — Z1231 Encounter for screening mammogram for malignant neoplasm of breast: Secondary | ICD-10-CM

## 2022-04-06 ENCOUNTER — Ambulatory Visit
Admission: RE | Admit: 2022-04-06 | Discharge: 2022-04-06 | Disposition: A | Discharge status: Home / Self Care | Source: Ambulatory Visit | Attending: Family Medicine | Admitting: Family Medicine

## 2022-04-06 DIAGNOSIS — Z1231 Encounter for screening mammogram for malignant neoplasm of breast: Secondary | ICD-10-CM

## 2023-03-29 ENCOUNTER — Other Ambulatory Visit: Payer: Self-pay | Admitting: Family Medicine

## 2023-03-29 DIAGNOSIS — Z1231 Encounter for screening mammogram for malignant neoplasm of breast: Secondary | ICD-10-CM

## 2023-04-14 ENCOUNTER — Ambulatory Visit
Admission: RE | Admit: 2023-04-14 | Discharge: 2023-04-14 | Disposition: A | Discharge status: Home / Self Care | Source: Ambulatory Visit | Attending: Family Medicine | Admitting: Family Medicine

## 2023-04-14 DIAGNOSIS — Z1231 Encounter for screening mammogram for malignant neoplasm of breast: Secondary | ICD-10-CM

## 2023-06-30 ENCOUNTER — Other Ambulatory Visit: Payer: Self-pay | Admitting: Medical Genetics

## 2023-06-30 DIAGNOSIS — Z006 Encounter for examination for normal comparison and control in clinical research program: Secondary | ICD-10-CM

## 2024-02-05 ENCOUNTER — Other Ambulatory Visit: Payer: Self-pay | Admitting: Family Medicine

## 2024-02-05 ENCOUNTER — Other Ambulatory Visit (HOSPITAL_COMMUNITY): Payer: Self-pay | Admitting: Family Medicine

## 2024-02-05 DIAGNOSIS — E7800 Pure hypercholesterolemia, unspecified: Secondary | ICD-10-CM

## 2024-02-05 DIAGNOSIS — E2839 Other primary ovarian failure: Secondary | ICD-10-CM

## 2024-03-04 ENCOUNTER — Ambulatory Visit (HOSPITAL_COMMUNITY)
Admission: RE | Admit: 2024-03-04 | Discharge: 2024-03-04 | Disposition: A | Discharge status: Home / Self Care | Payer: Self-pay | Source: Ambulatory Visit | Attending: Family Medicine | Admitting: Family Medicine

## 2024-03-04 DIAGNOSIS — E7800 Pure hypercholesterolemia, unspecified: Secondary | ICD-10-CM | POA: Insufficient documentation

## 2024-04-18 ENCOUNTER — Other Ambulatory Visit: Payer: Self-pay | Admitting: Family Medicine

## 2024-04-18 DIAGNOSIS — Z1231 Encounter for screening mammogram for malignant neoplasm of breast: Secondary | ICD-10-CM

## 2024-04-30 ENCOUNTER — Ambulatory Visit
Admission: RE | Admit: 2024-04-30 | Discharge: 2024-04-30 | Disposition: A | Discharge status: Home / Self Care | Source: Ambulatory Visit | Attending: Family Medicine | Admitting: Family Medicine

## 2024-04-30 DIAGNOSIS — Z1231 Encounter for screening mammogram for malignant neoplasm of breast: Secondary | ICD-10-CM | POA: Diagnosis not present

## 2024-04-30 DIAGNOSIS — M9905 Segmental and somatic dysfunction of pelvic region: Secondary | ICD-10-CM | POA: Diagnosis not present

## 2024-04-30 DIAGNOSIS — M62830 Muscle spasm of back: Secondary | ICD-10-CM | POA: Diagnosis not present

## 2024-04-30 DIAGNOSIS — M9903 Segmental and somatic dysfunction of lumbar region: Secondary | ICD-10-CM | POA: Diagnosis not present

## 2024-04-30 DIAGNOSIS — M9902 Segmental and somatic dysfunction of thoracic region: Secondary | ICD-10-CM | POA: Diagnosis not present

## 2024-06-18 DIAGNOSIS — M9905 Segmental and somatic dysfunction of pelvic region: Secondary | ICD-10-CM | POA: Diagnosis not present

## 2024-06-18 DIAGNOSIS — M62830 Muscle spasm of back: Secondary | ICD-10-CM | POA: Diagnosis not present

## 2024-06-18 DIAGNOSIS — M9903 Segmental and somatic dysfunction of lumbar region: Secondary | ICD-10-CM | POA: Diagnosis not present

## 2024-06-18 DIAGNOSIS — M9902 Segmental and somatic dysfunction of thoracic region: Secondary | ICD-10-CM | POA: Diagnosis not present

## 2024-07-16 DIAGNOSIS — M9905 Segmental and somatic dysfunction of pelvic region: Secondary | ICD-10-CM | POA: Diagnosis not present

## 2024-07-16 DIAGNOSIS — M62830 Muscle spasm of back: Secondary | ICD-10-CM | POA: Diagnosis not present

## 2024-07-16 DIAGNOSIS — M9903 Segmental and somatic dysfunction of lumbar region: Secondary | ICD-10-CM | POA: Diagnosis not present

## 2024-07-16 DIAGNOSIS — M9902 Segmental and somatic dysfunction of thoracic region: Secondary | ICD-10-CM | POA: Diagnosis not present

## 2024-08-12 DIAGNOSIS — F419 Anxiety disorder, unspecified: Secondary | ICD-10-CM | POA: Diagnosis not present

## 2024-08-12 DIAGNOSIS — E039 Hypothyroidism, unspecified: Secondary | ICD-10-CM | POA: Diagnosis not present

## 2024-08-12 DIAGNOSIS — G4700 Insomnia, unspecified: Secondary | ICD-10-CM | POA: Diagnosis not present

## 2024-08-12 DIAGNOSIS — E7800 Pure hypercholesterolemia, unspecified: Secondary | ICD-10-CM | POA: Diagnosis not present

## 2024-08-12 DIAGNOSIS — Z6830 Body mass index (BMI) 30.0-30.9, adult: Secondary | ICD-10-CM | POA: Diagnosis not present

## 2024-08-12 DIAGNOSIS — Z23 Encounter for immunization: Secondary | ICD-10-CM | POA: Diagnosis not present

## 2024-08-12 DIAGNOSIS — R202 Paresthesia of skin: Secondary | ICD-10-CM | POA: Diagnosis not present

## 2024-08-13 DIAGNOSIS — M9905 Segmental and somatic dysfunction of pelvic region: Secondary | ICD-10-CM | POA: Diagnosis not present

## 2024-08-13 DIAGNOSIS — M545 Low back pain, unspecified: Secondary | ICD-10-CM | POA: Diagnosis not present

## 2024-08-13 DIAGNOSIS — M9903 Segmental and somatic dysfunction of lumbar region: Secondary | ICD-10-CM | POA: Diagnosis not present

## 2024-08-13 DIAGNOSIS — M9902 Segmental and somatic dysfunction of thoracic region: Secondary | ICD-10-CM | POA: Diagnosis not present

## 2024-08-13 DIAGNOSIS — M62830 Muscle spasm of back: Secondary | ICD-10-CM | POA: Diagnosis not present

## 2024-08-13 DIAGNOSIS — M9901 Segmental and somatic dysfunction of cervical region: Secondary | ICD-10-CM | POA: Diagnosis not present

## 2024-08-14 ENCOUNTER — Ambulatory Visit: Admitting: Podiatry

## 2024-08-14 ENCOUNTER — Ambulatory Visit

## 2024-08-14 DIAGNOSIS — M7751 Other enthesopathy of right foot: Secondary | ICD-10-CM

## 2024-08-14 DIAGNOSIS — T691XXA Chilblains, initial encounter: Secondary | ICD-10-CM | POA: Diagnosis not present

## 2024-08-14 NOTE — Progress Notes (Signed)
   Chief Complaint  Patient presents with   Numbness    Bilateral foot numbness and cold sensation x 35yr. And into ankle.  Not diabetic.  No anti coag    HPI: 68 y.o. female presenting today for evaluation of cold sensation to the bilateral feet.   slowly progressive onset over 1 year now.  No pain.  Past Medical History:  Diagnosis Date   Thyroid  disease     Past Surgical History:  Procedure Laterality Date   CHOLECYSTECTOMY     THYROIDECTOMY     TONSILLECTOMY      Allergies  Allergen Reactions   Morphine And Codeine    Sulfa Antibiotics      Physical Exam: General: The patient is alert and oriented x3 in no acute distress.  Dermatology: Skin is warm, dry and supple bilateral lower extremities.   Vascular: Palpable pedal pulses bilaterally.Skin is cool to touch with a delayed capillary refill consistent with a chilblains/Raynaud's phenomenon  Neurological: Grossly intact via light touch  Musculoskeletal Exam: No pedal deformities noted  Radiographic Exam B/L feet 08/14/2024:  Normal osseous mineralization. Joint spaces preserved.  No fractures or osseous irregularities noted.  Assessment/Plan of Care: 1.  Chilblains syndrome/Raynaud's phenomena bilateral feet  -Patient evaluated.  X-rays reviewed -Pedal pulses are nice and palpable.  There is a delayed capillary refill consistent with a chilblains syndrome.  Reassured the patient that there is nothing overly concerning but she does need to be aware of her feet and keep her feet warm especially during the winter months -Recommend good supportive shoes and sneakers with comfortable socks -Return to clinic PRN      Thresa EMERSON Sar, DPM [facility]  Dr. Thresa EMERSON Sar, DPM    [address] [place], [place] [address]                Office [phone]  Fax [phone]

## 2024-08-22 ENCOUNTER — Other Ambulatory Visit: Payer: Self-pay | Admitting: Medical Genetics

## 2024-08-22 DIAGNOSIS — Z006 Encounter for examination for normal comparison and control in clinical research program: Secondary | ICD-10-CM

## 2024-09-17 DIAGNOSIS — M9903 Segmental and somatic dysfunction of lumbar region: Secondary | ICD-10-CM | POA: Diagnosis not present

## 2024-09-17 DIAGNOSIS — M62830 Muscle spasm of back: Secondary | ICD-10-CM | POA: Diagnosis not present

## 2024-09-17 DIAGNOSIS — M9901 Segmental and somatic dysfunction of cervical region: Secondary | ICD-10-CM | POA: Diagnosis not present

## 2024-09-17 DIAGNOSIS — M9902 Segmental and somatic dysfunction of thoracic region: Secondary | ICD-10-CM | POA: Diagnosis not present

## 2024-09-17 DIAGNOSIS — M545 Low back pain, unspecified: Secondary | ICD-10-CM | POA: Diagnosis not present

## 2024-09-17 DIAGNOSIS — M9905 Segmental and somatic dysfunction of pelvic region: Secondary | ICD-10-CM | POA: Diagnosis not present

## 2024-09-17 LAB — GENECONNECT MOLECULAR SCREEN: Genetic Analysis Overall Interpretation: NEGATIVE

## 2024-10-25 ENCOUNTER — Other Ambulatory Visit

## 2024-11-19 ENCOUNTER — Ambulatory Visit (HOSPITAL_BASED_OUTPATIENT_CLINIC_OR_DEPARTMENT_OTHER)
Admission: RE | Admit: 2024-11-19 | Discharge: 2024-11-19 | Disposition: A | Discharge status: Home / Self Care | Source: Ambulatory Visit | Attending: Family Medicine | Admitting: Family Medicine

## 2024-11-19 DIAGNOSIS — E2839 Other primary ovarian failure: Secondary | ICD-10-CM | POA: Insufficient documentation
